# Patient Record
Sex: Male | Born: 1980 | Race: White | Hispanic: No | Marital: Single | Smoking: Current every day smoker
Health system: Southern US, Community
[De-identification: ages and names within clinical notes are randomized; demographics above are authoritative.]

## PROBLEM LIST (undated history)

## (undated) DIAGNOSIS — B1920 Unspecified viral hepatitis C without hepatic coma: Secondary | ICD-10-CM

---

## 2011-07-17 ENCOUNTER — Encounter (HOSPITAL_COMMUNITY): Payer: Self-pay | Admitting: *Deleted

## 2011-07-17 ENCOUNTER — Emergency Department (HOSPITAL_COMMUNITY)
Admission: EM | Admit: 2011-07-17 | Discharge: 2011-07-17 | Disposition: A | Discharge status: Home / Self Care | Payer: Medicaid Other | Attending: Emergency Medicine | Admitting: Emergency Medicine

## 2011-07-17 ENCOUNTER — Emergency Department (HOSPITAL_COMMUNITY): Payer: Medicaid Other

## 2011-07-17 DIAGNOSIS — W540XXA Bitten by dog, initial encounter: Secondary | ICD-10-CM | POA: Insufficient documentation

## 2011-07-17 DIAGNOSIS — S51852A Open bite of left forearm, initial encounter: Secondary | ICD-10-CM

## 2011-07-17 DIAGNOSIS — F17200 Nicotine dependence, unspecified, uncomplicated: Secondary | ICD-10-CM | POA: Insufficient documentation

## 2011-07-17 DIAGNOSIS — B1920 Unspecified viral hepatitis C without hepatic coma: Secondary | ICD-10-CM | POA: Insufficient documentation

## 2011-07-17 DIAGNOSIS — S51809A Unspecified open wound of unspecified forearm, initial encounter: Secondary | ICD-10-CM | POA: Insufficient documentation

## 2011-07-17 HISTORY — DX: Unspecified viral hepatitis C without hepatic coma: B19.20

## 2011-07-17 MED ORDER — AMOXICILLIN-POT CLAVULANATE 875-125 MG PO TABS: 1.0000 | ORAL_TABLET | Freq: Two times a day (BID) | ORAL | Status: AC

## 2011-07-17 MED ORDER — HYDROMORPHONE HCL PF 1 MG/ML IJ SOLN
1.0000 mg | Freq: Once | INTRAMUSCULAR | Status: AC
  Administered 2011-07-17: 1 mg via INTRAMUSCULAR
  Filled 2011-07-17: qty 1

## 2011-07-17 MED ORDER — OXYCODONE HCL 5 MG PO TABS: 5.0000 mg | ORAL_TABLET | Freq: Four times a day (QID) | ORAL | Status: AC | PRN

## 2011-07-17 MED ORDER — BACITRACIN ZINC 500 UNIT/GM EX OINT
TOPICAL_OINTMENT | CUTANEOUS | Status: AC
  Administered 2011-07-17: 23:00:00
  Filled 2011-07-17: qty 1.8

## 2011-07-17 NOTE — ED Provider Notes (Signed)
History     CSN: [id number]  Arrival date & time 07/17/11  1191   First MD Initiated Contact with Patient 07/17/11 1959      Chief Complaint  Patient presents with  . Animal Bite     The history is provided by the patient.   the patient reports he is walking some in his backyard looking at her car to buy when he was bit by the owners dog.  The dog is up-to-date on his shots.  He reports puncture was a lacerations to his left forearm.  He is unsure of his tetanus status.  He reports pain.  He has no numbness or tingling in his left hand.  His pain is moderate at this time  Past Medical History  Diagnosis Date  . Hepatitis C     History reviewed. No pertinent past surgical history.  History reviewed. No pertinent family history.  History  Substance Use Topics  . Smoking status: Current Everyday Smoker    Types: Cigarettes  . Smokeless tobacco: Not on file  . Alcohol Use: Yes      Review of Systems  All other systems reviewed and are negative.    Allergies  Tylenol  Home Medications   Current Outpatient Rx  Name Route Sig Dispense Refill  . AMOXICILLIN-POT CLAVULANATE 875-125 MG PO TABS Oral Take 1 tablet by mouth every 12 (twelve) hours. 14 tablet 0  . OXYCODONE HCL 5 MG PO TABS Oral Take 1 tablet (5 mg total) by mouth every 6 (six) hours as needed for pain. 12 tablet 0    BP 133/84  Pulse 85  Temp(Src) 99 F (37.2 C) (Oral)  Resp 18  SpO2 97%  Physical Exam  Nursing note and vitals reviewed. Constitutional: He is oriented to person, place, and time. He appears well-developed and well-nourished.  HENT:  Head: Normocephalic and atraumatic.  Eyes: EOM are normal.  Neck: Normal range of motion.  Cardiovascular: Normal rate, regular rhythm, normal heart sounds and intact distal pulses.   Pulmonary/Chest: Effort normal and breath sounds normal. No respiratory distress.  Abdominal: Soft. He exhibits no distension. There is no tenderness.  Musculoskeletal:  Normal range of motion.       2 puncture wounds of left posterior forearm with larger laceration of left mid anterior forearm.  He has normal flexion and extension of his fingers.  Normal left radial and ulnar pulses.  There is no evidence of a compartment syndrome with soft compartments.  No spreading erythema.  No crepitus  Neurological: He is alert and oriented to person, place, and time.  Skin: Skin is warm and dry.  Psychiatric: He has a normal mood and affect. Judgment normal.    ED Course  Procedures (including critical care time)  Labs Reviewed - No data to display Dg Forearm Left  07/17/2011  *RADIOLOGY REPORT*  Clinical Data: Dog bite  LEFT FOREARM - 2 VIEW  Comparison: None.  Findings: There is soft tissue swelling in the forearm with scattered subcutaneous gas bubbles.  No radiodense foreign body. No bony abnormality.  Negative for fracture.  No osseous degenerative change.  IMPRESSION:  1.  Subcutaneous gas bubbles. 2.  No radiodense foreign body or bony abnormality.  Original Report Authenticated By: Thora Lance III, M.D.     1. Dog bite of forearm, left, initial encounter       MDM  Infection warnings given.  Home on Augmentin.  Pain treated.  At your data the wound extensively  with approximately 1 L of normal saline utilizing high pressure syringe cover.  In her stance the importance of not closing these wounds.  He'll be instructed in wet-to-dry dressing technique        Lyanne Co, MD 07/17/11 2223

## 2011-07-17 NOTE — ED Notes (Signed)
Pt reports he was bitten by a black lab on left forearm. Reports the dog belongs to a person that he bought a car from. Pt has puncture wounds to left forearm. Bleeding controlled. Reports last tetanus shot was "years ago."

## 2013-01-18 IMAGING — CR DG FOREARM 2V*L*
2 series · 2 of 2 positions shown · non-contrast
Comparison: None.

CLINICAL DATA: Dog bite

LEFT FOREARM - 2 VIEW

[x forearm ap left]
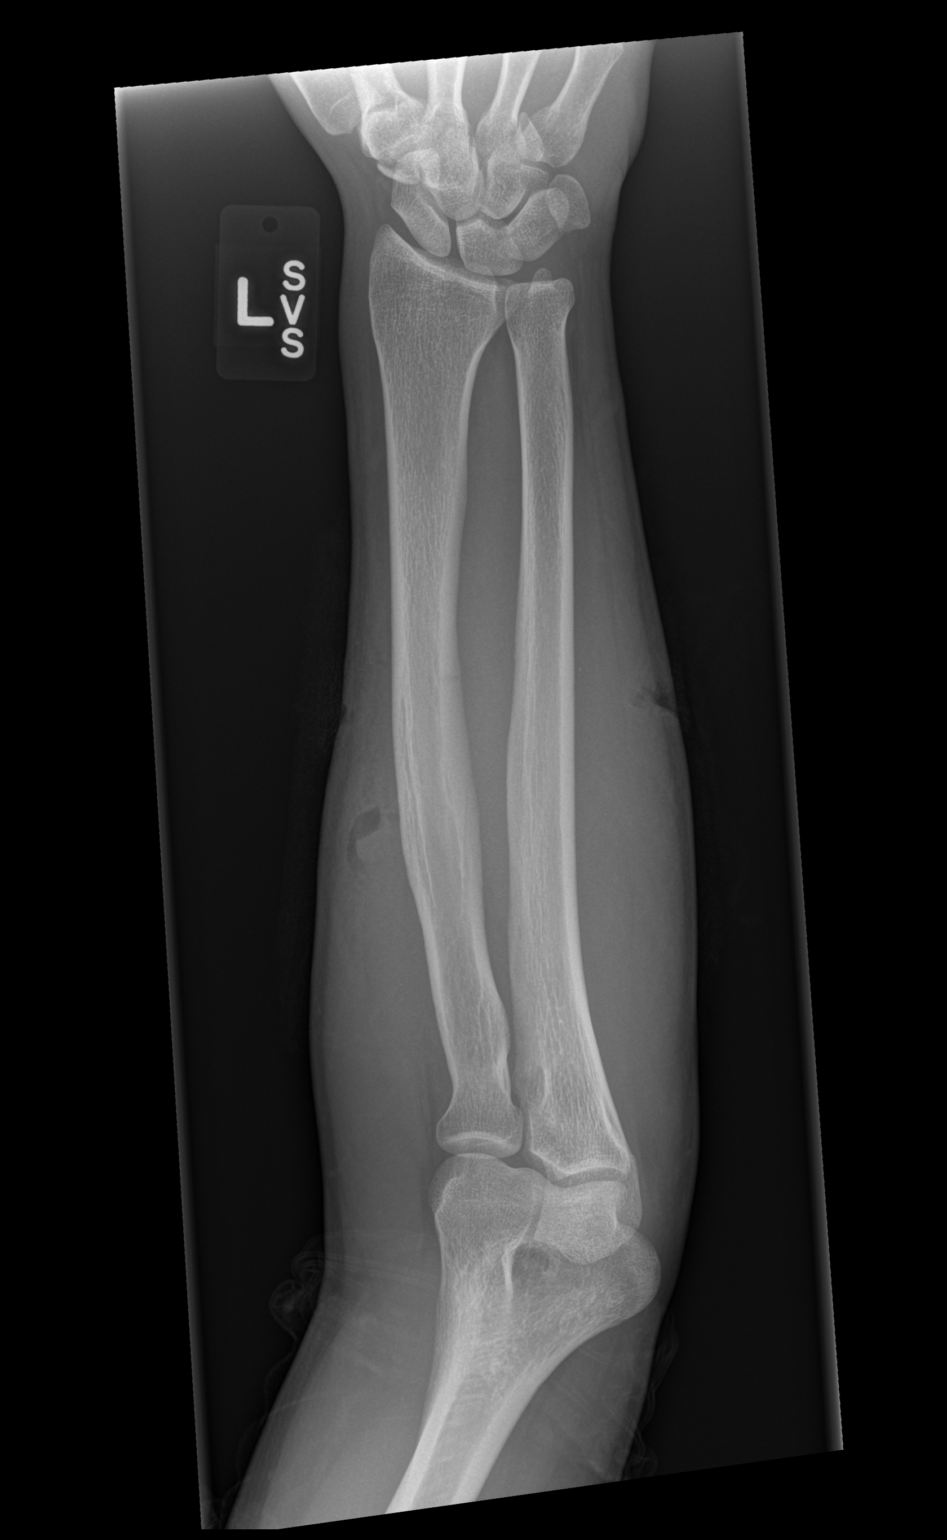

[x forearm lat left]
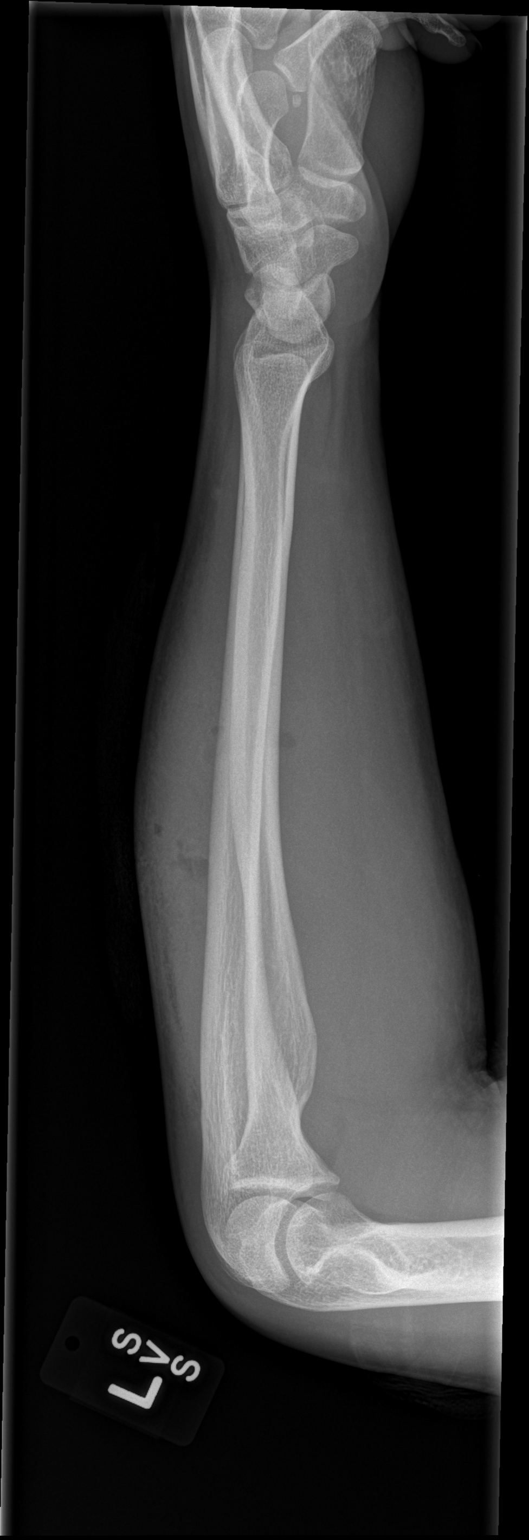

[2 of 2 positions shown; findings below may reference images not displayed]

FINDINGS: There is soft tissue swelling in the forearm with
scattered subcutaneous gas bubbles.  No radiodense foreign body.
No bony abnormality.  Negative for fracture.  No osseous
degenerative change.
IMPRESSION: 1.  Subcutaneous gas bubbles.
2.  No radiodense foreign body or bony abnormality.

## 2020-01-16 ENCOUNTER — Emergency Department (HOSPITAL_COMMUNITY)
Admission: EM | Admit: 2020-01-16 | Discharge: 2020-01-16 | Disposition: A | Discharge status: Home / Self Care | Payer: Self-pay | Attending: Emergency Medicine | Admitting: Emergency Medicine

## 2020-01-16 ENCOUNTER — Other Ambulatory Visit: Payer: Self-pay

## 2020-01-16 ENCOUNTER — Encounter (HOSPITAL_COMMUNITY): Payer: Self-pay | Admitting: Emergency Medicine

## 2020-01-16 DIAGNOSIS — H9203 Otalgia, bilateral: Secondary | ICD-10-CM | POA: Insufficient documentation

## 2020-01-16 DIAGNOSIS — F17210 Nicotine dependence, cigarettes, uncomplicated: Secondary | ICD-10-CM | POA: Insufficient documentation

## 2020-01-16 NOTE — ED Triage Notes (Signed)
Pt coming from home. Complaint of ear pain that has been going on for 3 years. Today pain got worse. NAD.

## 2020-01-16 NOTE — ED Provider Notes (Signed)
[facility] EMERGENCY DEPARTMENT Provider Note   CSN: [id number] Arrival date & time: 01/16/20  1757     History Chief Complaint  Patient presents with  . Otalgia    Brandon Le is a 39 y.o. male.  HPI Patient presents with bilateral ear pain.  States he has had it for 3 years.  Has been seen at multiple different hospitals for it.  States he is usually told that nothing is wrong.  Reviewing records appears he has been diagnosed with a stye to externa at times.  States that his mom took some pictures with something that lets her take a picture in his ear and said he looked up online what it was and said it was benign tumors.  States his ears feel full.  Also it hurts in front of his ears.  No difficulty hearing.  No fevers.  At times with a sore throat or nasal congestion but does not have any the right now.  States he cannot see ENT due to insurance issues.    Past Medical History:  Diagnosis Date  . Hepatitis C     There are no problems to display for this patient.   History reviewed. No pertinent surgical history.     No family history on file.  Social History   Tobacco Use  . Smoking status: Current Every Day Smoker    Types: Cigarettes  . Smokeless tobacco: Never Used  Substance Use Topics  . Alcohol use: Yes  . Drug use: No    Home Medications Prior to Admission medications   Not on File    Allergies    Tylenol [acetaminophen]  Review of Systems   Review of Systems  Constitutional: Negative for appetite change.  HENT: Positive for ear pain. Negative for ear discharge, facial swelling, postnasal drip, sore throat and trouble swallowing.   Eyes: Negative for pain.  Respiratory: Negative for shortness of breath.   Gastrointestinal: Negative for abdominal pain.  Genitourinary: Negative for flank pain.  Musculoskeletal: Negative for back pain.  Neurological: Negative for weakness.  Psychiatric/Behavioral: Negative for confusion.     Physical Exam Updated Vital Signs BP 105/61 (BP Location: Left Arm)   Pulse 77   Temp 98.9 F (37.2 C) (Oral)   Resp 16   Ht 5\' 4"  (1.626 m)   Wt 61.2 kg   SpO2 99%   BMI 23.17 kg/m   Physical Exam Vitals and nursing note reviewed.  HENT:     Head: Atraumatic.     Right Ear: Tympanic membrane, ear canal and external ear normal. There is no impacted cerumen.     Left Ear: Tympanic membrane, ear canal and external ear normal. There is no impacted cerumen.     Nose: No congestion.     Mouth/Throat:     Mouth: Mucous membranes are moist.     Pharynx: No oropharyngeal exudate or posterior oropharyngeal erythema.  Eyes:     Extraocular Movements: Extraocular movements intact.     Pupils: Pupils are equal, round, and reactive to light.  Cardiovascular:     Rate and Rhythm: Regular rhythm.  Pulmonary:     Breath sounds: No wheezing, rhonchi or rales.  Abdominal:     Tenderness: There is no abdominal tenderness.  Musculoskeletal:        General: No tenderness.     Cervical back: Neck supple.  Skin:    General: Skin is warm.     Capillary Refill: Capillary refill takes less  than 2 seconds.  Neurological:     Mental Status: He is alert and oriented to person, place, and time.     ED Results / Procedures / Treatments   Labs (all labs ordered are listed, but only abnormal results are displayed) Labs Reviewed - No data to display  EKG None  Radiology No results found.  Procedures Procedures (including critical care time)  Medications Ordered in ED Medications - No data to display  ED Course  I have reviewed the triage vital signs and the nursing notes.  Pertinent labs & imaging results that were available during my care of the patient were reviewed by me and considered in my medical decision making (see chart for details).    MDM Rules/Calculators/A&P                         Patient with acute on chronic ear pain.  States his mom looked in there and saw  "benign tumors.  However on my exam it looks rather normal.  Good hearing out of both ears.  External canal and TM both look normal.  Posterior pharynx also reassuring.  At this point I do not see any direct pathology of the ear.  Will have follow-up with ENT as needed.  Over-the-counter decongestants potentially could help.  Final Clinical Impression(s) / ED Diagnoses Final diagnoses:  Ear pain, bilateral    Rx / DC Orders ED Discharge Orders    None       Benjiman Core, MD 01/16/20 2012

## 2020-01-16 NOTE — Discharge Instructions (Addendum)
Follow-up with ear nose and throat doctors.  They should hopefully be able to determine a better cause of your chronic ear pain.  Over-the-counter decongestant may help with the ear pain.  The pharmacist should be able to help you find one.

## 2020-01-16 NOTE — ED Notes (Signed)
Went to D/C pt and pt was already gone. Per staff, pt was seen leaving. Unable to obtain VS d/t pt leaving prior to D/C papers and VS. Previous VSS, pt A&Ox4 and in NAD.

## 2020-01-16 NOTE — ED Notes (Signed)
Pt reports that he has bilateral ear pain x 3 years. Asked pt what was new that prompted him to come to the ED. He reports that his mom has a picture from where they "bought a thing to look in my ear and take pictures and I looked it up and it says it's benign tumors". Pt reports pressure to bilateral ears.
